# Patient Record
Sex: Female | Born: 1945 | Race: Black or African American | Hispanic: No | Marital: Married | Smoking: Never smoker
Health system: Southern US, Community
[De-identification: ages and names within clinical notes are randomized; demographics above are authoritative.]

## PROBLEM LIST (undated history)

## (undated) DIAGNOSIS — D219 Benign neoplasm of connective and other soft tissue, unspecified: Secondary | ICD-10-CM

## (undated) DIAGNOSIS — I10 Essential (primary) hypertension: Secondary | ICD-10-CM

## (undated) DIAGNOSIS — M8580 Other specified disorders of bone density and structure, unspecified site: Secondary | ICD-10-CM

## (undated) DIAGNOSIS — Z86010 Personal history of colonic polyps: Secondary | ICD-10-CM

## (undated) DIAGNOSIS — R351 Nocturia: Secondary | ICD-10-CM

## (undated) HISTORY — DX: Personal history of colonic polyps: Z86.010

## (undated) HISTORY — DX: Benign neoplasm of connective and other soft tissue, unspecified: D21.9

## (undated) HISTORY — PX: TUBAL LIGATION: SHX77

## (undated) HISTORY — DX: Other specified disorders of bone density and structure, unspecified site: M85.80

## (undated) HISTORY — DX: Essential (primary) hypertension: I10

## (undated) HISTORY — DX: Nocturia: R35.1

## (undated) HISTORY — PX: MYOMECTOMY: SHX85

---

## 1994-04-10 HISTORY — PX: SALPINGECTOMY: SHX328

## 1997-10-29 ENCOUNTER — Ambulatory Visit (HOSPITAL_COMMUNITY): Admission: RE | Admit: 1997-10-29 | Discharge: 1997-10-29 | Payer: Self-pay | Admitting: Internal Medicine

## 1998-09-10 ENCOUNTER — Other Ambulatory Visit: Admission: RE | Admit: 1998-09-10 | Discharge: 1998-09-10 | Payer: Self-pay | Admitting: Obstetrics and Gynecology

## 1999-09-22 ENCOUNTER — Other Ambulatory Visit: Admission: RE | Admit: 1999-09-22 | Discharge: 1999-09-22 | Payer: Self-pay | Admitting: Obstetrics and Gynecology

## 2000-02-20 ENCOUNTER — Emergency Department (HOSPITAL_COMMUNITY): Admission: EM | Admit: 2000-02-20 | Discharge: 2000-02-20 | Payer: Self-pay | Admitting: Emergency Medicine

## 2000-02-20 ENCOUNTER — Encounter: Payer: Self-pay | Admitting: Emergency Medicine

## 2000-12-06 ENCOUNTER — Other Ambulatory Visit: Admission: RE | Admit: 2000-12-06 | Discharge: 2000-12-06 | Payer: Self-pay | Admitting: Obstetrics and Gynecology

## 2002-01-15 ENCOUNTER — Other Ambulatory Visit: Admission: RE | Admit: 2002-01-15 | Discharge: 2002-01-15 | Payer: Self-pay | Admitting: Obstetrics and Gynecology

## 2002-09-26 ENCOUNTER — Encounter: Payer: Self-pay | Admitting: Emergency Medicine

## 2002-09-26 ENCOUNTER — Inpatient Hospital Stay (HOSPITAL_COMMUNITY): Admission: EM | Admit: 2002-09-26 | Discharge: 2002-09-28 | Payer: Self-pay | Admitting: Emergency Medicine

## 2002-09-26 ENCOUNTER — Encounter (INDEPENDENT_AMBULATORY_CARE_PROVIDER_SITE_OTHER): Payer: Self-pay | Admitting: *Deleted

## 2002-09-26 ENCOUNTER — Encounter: Payer: Self-pay | Admitting: General Surgery

## 2002-09-27 ENCOUNTER — Encounter: Payer: Self-pay | Admitting: Surgery

## 2003-04-11 DIAGNOSIS — M8580 Other specified disorders of bone density and structure, unspecified site: Secondary | ICD-10-CM

## 2003-04-11 HISTORY — DX: Other specified disorders of bone density and structure, unspecified site: M85.80

## 2003-10-21 ENCOUNTER — Other Ambulatory Visit: Admission: RE | Admit: 2003-10-21 | Discharge: 2003-10-21 | Payer: Self-pay | Admitting: Obstetrics and Gynecology

## 2003-11-03 ENCOUNTER — Ambulatory Visit (HOSPITAL_COMMUNITY): Admission: RE | Admit: 2003-11-03 | Discharge: 2003-11-03 | Payer: Self-pay | Admitting: Gastroenterology

## 2003-11-03 ENCOUNTER — Encounter (INDEPENDENT_AMBULATORY_CARE_PROVIDER_SITE_OTHER): Payer: Self-pay | Admitting: Specialist

## 2004-11-01 ENCOUNTER — Other Ambulatory Visit: Admission: RE | Admit: 2004-11-01 | Discharge: 2004-11-01 | Payer: Self-pay | Admitting: Obstetrics and Gynecology

## 2005-11-15 ENCOUNTER — Other Ambulatory Visit: Admission: RE | Admit: 2005-11-15 | Discharge: 2005-11-15 | Payer: Self-pay | Admitting: Obstetrics and Gynecology

## 2006-02-03 ENCOUNTER — Emergency Department (HOSPITAL_COMMUNITY): Admission: EM | Admit: 2006-02-03 | Discharge: 2006-02-04 | Payer: Self-pay | Admitting: Emergency Medicine

## 2007-05-23 ENCOUNTER — Emergency Department (HOSPITAL_COMMUNITY): Admission: EM | Admit: 2007-05-23 | Discharge: 2007-05-23 | Payer: Self-pay | Admitting: Emergency Medicine

## 2009-11-01 ENCOUNTER — Encounter: Admission: RE | Admit: 2009-11-01 | Discharge: 2009-11-01 | Payer: Self-pay | Admitting: Internal Medicine

## 2010-08-26 NOTE — Op Note (Signed)
NAME:  Suzanne Chung, Suzanne Chung                      ACCOUNT NO.:  [id number]   MEDICAL RECORD NO.:  [id number]                   PATIENT TYPE:  AMB   LOCATION:  ENDO                                 FACILITY:  [facility]   PHYSICIAN:  Petra Kuba, M.D.                 DATE OF BIRTH:  1945/05/05   DATE OF PROCEDURE:  11/03/2003  DATE OF DISCHARGE:                                 OPERATIVE REPORT   PROCEDURE:  Colonoscopy with polypectomy.   INDICATION:  Screening.  Consent was signed after risks, benefits, methods,  options thoroughly discussed in the office.   MEDICINES USED:  1. Demerol 60.  2. Versed 7.   DESCRIPTION OF PROCEDURE:  Rectal inspection was pertinent for external  hemorrhoids, small.  Digital exam was negative.  The video pediatric  adjustable colonoscope was inserted, fairly easily advanced around the colon  to the cecum.  This did require some abdominal pressure but no position  changes.  No abnormality was seen on insertion.  The cecum was identified by  the appendiceal orifice and the ileocecal valve.  The prep was adequate.  There was some liquid stool that required washing and suctioning.  On slow  withdrawal through the colon, in the cecum a tiny-to-small polyp was seen  and was hot biopsied x 2.  There was an additional 3 ascending and  transverse tiny polyps which were hot biopsied and put in the first  container.  Scope was withdrawn around the left side of the colon, and 2  tiny left-sided polyps were seen, hot biopsied, and put in a second  container.  No other abnormalities were seen as we slowly withdrew back to  the rectum.  Anorectal pull-through and retroflexion confirmed some small  hemorrhoids.  The scope was reinserted a short ways up the left side of the  colon; air was suctioned, scope removed.  The patient tolerated the  procedure well.  There was no obvious immediate complication.   ENDOSCOPIC DIAGNOSES:  1. Internal/external hemorrhoids.  2. Few  tiny left and right polyps, hot biopsied in all areas.  3. Otherwise, within normal limits to the cecum.   PLAN:  1. Await pathology to determine future colonic screening.  2. Happy to see back p.r.n..  3. Otherwise, return care to Dr. Su Hilt for the customary health care     maintenance to include yearly rectals and guaiacs.                                               Petra Kuba, M.D.    MEM/MEDQ  D:  11/03/2003  T:  11/03/2003  Job:  [id number]   cc:   Antony Madura, M.D.  [address]  [place]  [place] [address]  Fax: [phone]

## 2010-08-26 NOTE — Op Note (Signed)
NAME:  Suzanne Chung, Suzanne Chung                      ACCOUNT NO.:  [id number]   MEDICAL RECORD NO.:  [id number]                   PATIENT TYPE:  INP   LOCATION:  0476                                 FACILITY:  [facility]   PHYSICIAN:  Ollen Gross. Vernell Morgans, M.D.              DATE OF BIRTH:  01/22/1946   DATE OF PROCEDURE:  09/26/2002  DATE OF DISCHARGE:  09/28/2002                                 OPERATIVE REPORT   PREOPERATIVE DIAGNOSIS:  Cholecystitis.   POSTOPERATIVE DIAGNOSIS:  Cholecystitis.   PROCEDURE:  Laparoscopic cholecystectomy with intraoperative cholangiogram.   SURGEON:  Ollen Gross. Carolynne Edouard, M.D.   ASSISTANT:  Currie Paris, M.D.   ANESTHESIA:  General endotracheal.   DESCRIPTION OF PROCEDURE:  After informed consent was obtained, the patient  was brought to the operating room, placed in a supine position on the  operating room table. After adequate induction of general anesthesia, the  patient's abdomen was prepped with Betadine and draped in the usual sterile  manner. The area above the umbilicus was infiltrated with 0.25% Marcaine, a  small incision was made with a 15 blade knife. This incision was carried  down through the skin and subcutaneous tissue using blunt dissection, using  the Kelly clamp and [employer] retractors until the linea alba was  identified. The linea alba was incised with a 15 blade knife and each side  was grasped with Kocher clamps and elevated anteriorly. The preperitoneal  space was then probed bluntly with a hemostat until the peritoneum was  opened and access was gained to the abdominal cavity. A #0 Vicryl  pursestring stitch was placed in the fascia surrounding the opening. A  Hasson cannula was placed through the opening and anchored in place to the  previously placed Vicryl pursestring stitch. The abdomen was then  insufflated with carbon dioxide without difficulty. The patient was placed  in the head up position, a laparoscope was placed through  the Hasson cannula  and the right upper quadrant was inspected. The dome of the gallbladder and  liver were readily identified and the gastric region was then infiltrated  with 0.25% Marcaine and a small incision was made with a 15 blade knife and  a 10 mm port was placed bluntly through this incision into the abdominal  cavity under direct vision. Sites are then chosen laterally on the right  side of the abdomen for placement of 5 mm ports. Each of these areas was  infiltrated with 0.25% Marcaine. Small stab incisions were made with a 15  blade knife, 5 mm ports were placed bluntly through these incisions into the  abdominal cavity under direct vision. An aspirating device was then placed  through the epigastric port. The gallbladder was aspirated of its contents  so that it could be grasped. A blunt grasper was then placed through the  lateral most 5 mm port and used to grasp the dome of the gallbladder and  elevate it anteriorly and superiorly. Another blunt grasper was placed  through the other 5 mm port and used to retract on the body and neck of the  gallbladder. A dissector was placed through the upper midline port through  the epigastric port and using the electrocautery, the peritoneal reflection  at the gallbladder neck was opened, blunt dissection was then used carefully  in this area until the gallbladder neck cystic duct junction was readily  identified and a good window was created. This area was dissected bluntly  circumferentially until the window was created. Care was taken to keep the  common duct medial to the dissection. A single clip was placed on the  gallbladder neck, a small ductotomy was made on the cystic duct. A 14 gauge  Angiocath was then placed percutaneously into the anterior abdominal wall  and a Reddick cholangiogram catheter was placed through the NG catheter and  flushed. The Reddick catheter was then placed within the cystic duct and  anchored in place  with a clip. A cholangiogram was obtained that showed no  filling defects, good emptying into the duodenum and adequate length on the  cystic duct. The anchoring clip and catheter was then removed from the  patient. Three clips were placed proximally on the cystic duct and the duct  was divided between the two sets of clips. Posterior to this, the cystic  artery was identified and again dissected in a circumferential manner  bluntly until a good window was created. Two clips were placed proximally,  one distally on the artery and the artery was divided between the two. Next,  a laparoscopic hook cautery device was used to separate the gallbladder from  the liver bed. Prior to completely detaching the gallbladder from the liver  bed, the liver bed was inspected and several small bleeding points were  coagulated with the electrocautery. The gallbladder was then detached the  rest of the way from the liver bed without difficulty and an endoscopic bag  was placed within the abdomen. The gallbladder was placed within the bag and  the bag was sealed. The laparoscope was then moved to the upper midline port  and a gallbladder grasper was placed through the Hasson cannula and used to  grasp the opening in the bag. The bag with the gallbladder was then removed  through the supraumbilical port without difficulty. The fascial defect was  then closed with the previously placed Vicryl pursestring stitch. The  abdomen was then irrigated with copious amounts of saline until the effluent  was clear.  Liver bed was inspected again and found to be hemostatic. The  ports were then all removed under direct vision and were found to be  hemostatic, gas was allowed to escape. The skin incisions were all closed  with interrupted 4-0 Monocryl subcuticular stitch, Benzoin and Steri-Strips  were applied. At the end of the case, all needle, sponge and instrument counts were correct. The patient was then awakened and  taken to the recovery  room in stable condition.                                               Ollen Gross. Vernell Morgans, M.D.    PST/MEDQ  D:  10/02/2002  T:  10/02/2002  Job:  [id number]

## 2010-08-26 NOTE — H&P (Signed)
NAME:  Suzanne Chung, Suzanne Chung                      ACCOUNT NO.:  [id number]   MEDICAL RECORD NO.:  [id number]                   PATIENT TYPE:  EMS   LOCATION:  ED                                   FACILITY:  [facility]   PHYSICIAN:  Ollen Gross. Vernell Morgans, M.D.              DATE OF BIRTH:  19-Jan-1946   DATE OF ADMISSION:  09/26/2002  DATE OF DISCHARGE:                                HISTORY & PHYSICAL   HISTORY OF PRESENT ILLNESS:  The patient is a 65 year old white female who  states that shortly after eating chicken fingers for dinner last night she  developed epigastric pain.  The pain was localized to her epigastric/right  upper quadrant region, did not radiate to her back.  The pain became more  severe as the evening went on, and she had several episodes of vomiting with  this.  She is not running any fever.  The pain is still present but less  severe than it was earlier this evening.  She became concerned enough that  she came to the emergency department for further evaluation.   REVIEW OF SYSTEMS:  She currently does not have any nausea or vomiting,  fevers, chills, chest pain, shortness of breath, diarrhea, dysuria.  She  does have some dizziness which she has been dealing with since about May.  The rest of her review of systems is unremarkable.   PAST MEDICAL HISTORY:  Significant for dizziness since May, question of some  high blood pressure.   PAST SURGICAL HISTORY:  Significant for excision of fibroids from the  uterus.   MEDICATIONS:  Cefalexin.   ALLERGIES:  No known drug allergies.   SOCIAL HISTORY:  She denies use of alcohol or tobacco products.   FAMILY HISTORY:  Noncontributory.   PHYSICAL EXAMINATION:  VITAL SIGNS:  Temperature is 99.3, blood pressure  177/93, pulse of 93.  GENERAL:  Moderately obese black female in no acute distress.  SKIN:  Warm and dry.  With no jaundice.  HEENT:  Eyes, extraocular muscles are intact.  Pupils are equal, round, and  reactive to  light.  Sclerae are nonicteric.  NECK:  Has no bruits.  I could not palpate any thyroid masses.  Trachea is  midline.  LUNGS:  Clear bilaterally, with no use of accessory respiratory muscles.  HEART:  Regular rate and rhythm with impulse in the left chest.  ABDOMEN:  Soft, with some mild tenderness in the epigastric region.  No  palpable mass or hepatosplenomegaly, but she is obese.  No guarding or  peritoneal signs.  EXTREMITIES:  No cyanosis, clubbing, or edema.  PSYCHOLOGIC:  She is alert and oriented x3 today, with no evidence of  anxiety or depression.   LABORATORY WORK:  On review of her laboratory work she had a slightly  elevated white count at 10,600.  She had many bacteria in her urine.  Her  electrolytes were okay, and her  liver functions were normal.  Her lipase was  also normal.   On review of her ultrasound she had stones in the gallbladder with  gallbladder wall thickening consistent with acute cholecystitis.  There was  no real dilatation of the bile ducts.   ASSESSMENT AND PLAN:  This is a 65 year old black female with acute  cholecystitis with cholelithiasis.  I have recommended that she consider  having her gallbladder removed, and she would also like to have this done.  I have explained to her in detail the risks and benefits of the operation as  well as some of the technical aspects, and she understands and wishes to  proceed.  We will plan to do this for her hopefully later today or tomorrow  in the operating room.  In the meantime, will admit her for IV hydration,  antibiotic therapy, and pain management.                                               Ollen Gross. Vernell Morgans, M.D.    PST/MEDQ  D:  09/26/2002  T:  09/26/2002  Job:  [id number]

## 2010-12-30 LAB — I-STAT 8, (EC8 V) (CONVERTED LAB)
BUN: 10
Bicarbonate: 24.9 — ABNORMAL HIGH
Glucose, Bld: 79
HCT: 42
Hemoglobin: 14.3
Sodium: 139
pCO2, Ven: 40 — ABNORMAL LOW
pH, Ven: 7.402 — ABNORMAL HIGH

## 2010-12-30 LAB — POCT CARDIAC MARKERS
CKMB, poc: 3.1
Myoglobin, poc: 88.9
Operator id: 198171

## 2010-12-30 LAB — POCT I-STAT CREATININE: Creatinine, Ser: 0.9

## 2010-12-30 LAB — D-DIMER, QUANTITATIVE: D-Dimer, Quant: <0.22

## 2011-12-20 ENCOUNTER — Other Ambulatory Visit: Payer: Self-pay | Admitting: Obstetrics and Gynecology

## 2011-12-20 DIAGNOSIS — Z1231 Encounter for screening mammogram for malignant neoplasm of breast: Secondary | ICD-10-CM

## 2012-01-16 ENCOUNTER — Ambulatory Visit: Payer: Self-pay

## 2012-01-18 DIAGNOSIS — Z86010 Personal history of colonic polyps: Secondary | ICD-10-CM | POA: Insufficient documentation

## 2012-01-18 DIAGNOSIS — M8580 Other specified disorders of bone density and structure, unspecified site: Secondary | ICD-10-CM | POA: Insufficient documentation

## 2012-01-18 DIAGNOSIS — R351 Nocturia: Secondary | ICD-10-CM | POA: Insufficient documentation

## 2012-01-18 DIAGNOSIS — D219 Benign neoplasm of connective and other soft tissue, unspecified: Secondary | ICD-10-CM | POA: Insufficient documentation

## 2012-01-18 DIAGNOSIS — I10 Essential (primary) hypertension: Secondary | ICD-10-CM | POA: Insufficient documentation

## 2012-01-23 ENCOUNTER — Ambulatory Visit (INDEPENDENT_AMBULATORY_CARE_PROVIDER_SITE_OTHER): Payer: Medicare Other

## 2012-01-23 DIAGNOSIS — Z1231 Encounter for screening mammogram for malignant neoplasm of breast: Secondary | ICD-10-CM

## 2012-01-24 ENCOUNTER — Ambulatory Visit (INDEPENDENT_AMBULATORY_CARE_PROVIDER_SITE_OTHER): Payer: Medicare Other | Admitting: Obstetrics and Gynecology

## 2012-01-24 ENCOUNTER — Ambulatory Visit (INDEPENDENT_AMBULATORY_CARE_PROVIDER_SITE_OTHER): Payer: Medicare Other

## 2012-01-24 ENCOUNTER — Encounter: Payer: Self-pay | Admitting: Obstetrics and Gynecology

## 2012-01-24 VITALS — BP 132/72 | Ht 61.25 in | Wt 183.0 lb

## 2012-01-24 DIAGNOSIS — Z780 Asymptomatic menopausal state: Secondary | ICD-10-CM

## 2012-01-24 DIAGNOSIS — Z124 Encounter for screening for malignant neoplasm of cervix: Secondary | ICD-10-CM

## 2012-01-24 DIAGNOSIS — R351 Nocturia: Secondary | ICD-10-CM

## 2012-01-24 DIAGNOSIS — D219 Benign neoplasm of connective and other soft tissue, unspecified: Secondary | ICD-10-CM

## 2012-01-24 DIAGNOSIS — M8580 Other specified disorders of bone density and structure, unspecified site: Secondary | ICD-10-CM

## 2012-01-24 NOTE — Progress Notes (Signed)
ANNUAL:  mcr PAP and pelvic  Last Pap: 01/23/2011 WNL: Yes Regular Periods:no Contraception: Post-Menopause  Monthly Breast exam:yes Tetanus<28yrs:yes Nl.Bladder Function:yes Daily BMs:yes Healthy Diet:yes Calcium:no Mammogram:yes Date of Mammogram: 01/23/2012 Exercise:yes Have often Exercise: 3 times weekly Seatbelt: yes Abuse at home: no Stressful work:no Sigmoid-colonoscopy: 2008 Bone Density: Yes 2011 PCP: Dr. Burton Apley Change in PMH: None Change in [facility]: None  Subjective:    Suzanne Chung is a 66 y.o. female G1P1 who presents for annual exam.  The patient has no complaints today.   The following portions of the patient's history were reviewed and updated as appropriate: allergies, current medications, past family history, past medical history, past social history, past surgical history and problem list.  Review of Systems Pertinent items are noted in HPI. Gastrointestinal:No change in bowel habits, no abdominal pain, no rectal bleeding Genitourinary:negative for dysuria, frequency, hematuria, nocturia and urinary incontinence    Objective:     BP 132/72  Ht 5' 1.25" (1.556 m)  Wt 183 lb (83.008 kg)  BMI 34.30 kg/m2  Weight:  Wt Readings from Last 1 Encounters:  01/24/12 183 lb (83.008 kg)     BMI: Body mass index is 34.30 kg/(m^2). General Appearance: Alert, appropriate appearance for age. No acute distress HEENT: Grossly normal Neck / Thyroid: Supple, no masses, nodes or enlargement Lungs: clear to auscultation bilaterally Back: No CVA tenderness Breast Exam: No masses or nodes.No dimpling, nipple retraction or discharge. Cardiovascular: Regular rate and rhythm. S1, S2, no murmur Gastrointestinal: Soft, non-tender, no masses or organomegaly Pelvic Exam: External genitalia: normal general appearance Vaginal: atrophic mucosa Cervix: normal appearance Adnexa: non palpable Uterus: doesn't feel enlarged Exam limited by body habitus Rectovaginal:  normal rectal, no masses Lymphatic Exam: Non-palpable nodes in neck, clavicular, axillary, or inguinal regions Skin: no rash or abnormalities Neurologic: Normal gait and speech, no tremor  Psychiatric: Alert and oriented, appropriate affect.    Urinalysis:Not done    Assessment:    Menopause no sx Hx of myomectomy with no sx of fibroids    Plan:   Mammogram done 01/24/12 results pending pap smear with HR HPV.  If nl, repeat in 3-5 yrears return annually or prn Follow-up:  For DXA   Dierdre Forth MD

## 2012-01-26 ENCOUNTER — Encounter: Payer: Self-pay | Admitting: Obstetrics and Gynecology

## 2012-01-26 LAB — PAP IG AND HPV HIGH-RISK: HPV DNA High Risk: NOT DETECTED

## 2013-10-29 ENCOUNTER — Emergency Department (HOSPITAL_COMMUNITY)
Admission: EM | Admit: 2013-10-29 | Discharge: 2013-10-29 | Disposition: A | Discharge status: Home / Self Care | Payer: Medicare PPO | Attending: Emergency Medicine | Admitting: Emergency Medicine

## 2013-10-29 ENCOUNTER — Encounter (HOSPITAL_COMMUNITY): Payer: Self-pay | Admitting: Emergency Medicine

## 2013-10-29 DIAGNOSIS — H1130 Conjunctival hemorrhage, unspecified eye: Secondary | ICD-10-CM | POA: Insufficient documentation

## 2013-10-29 DIAGNOSIS — R42 Dizziness and giddiness: Secondary | ICD-10-CM | POA: Insufficient documentation

## 2013-10-29 DIAGNOSIS — Z8739 Personal history of other diseases of the musculoskeletal system and connective tissue: Secondary | ICD-10-CM | POA: Insufficient documentation

## 2013-10-29 DIAGNOSIS — Z8742 Personal history of other diseases of the female genital tract: Secondary | ICD-10-CM | POA: Insufficient documentation

## 2013-10-29 DIAGNOSIS — Z860100 Personal history of colon polyps, unspecified: Secondary | ICD-10-CM | POA: Insufficient documentation

## 2013-10-29 DIAGNOSIS — I10 Essential (primary) hypertension: Secondary | ICD-10-CM | POA: Insufficient documentation

## 2013-10-29 DIAGNOSIS — Z79899 Other long term (current) drug therapy: Secondary | ICD-10-CM | POA: Insufficient documentation

## 2013-10-29 LAB — BASIC METABOLIC PANEL
Anion gap: 13 (ref 5–15)
BUN: 10 mg/dL (ref 6–23)
CO2: 28 mEq/L (ref 19–32)
CREATININE: 0.94 mg/dL (ref 0.50–1.10)
Calcium: 9.4 mg/dL (ref 8.4–10.5)
Chloride: 97 mEq/L (ref 96–112)
GFR calc Af Amer: 71 mL/min — ABNORMAL LOW (ref >90)
GFR, EST NON AFRICAN AMERICAN: 61 mL/min — AB (ref >90)
GLUCOSE: 110 mg/dL — AB (ref 70–99)
Potassium: 3.2 mEq/L — ABNORMAL LOW (ref 3.7–5.3)
SODIUM: 138 meq/L (ref 137–147)

## 2013-10-29 MED ORDER — AMLODIPINE BESYLATE 5 MG PO TABS: 5.0000 mg | ORAL_TABLET | Freq: Once | ORAL | Status: AC

## 2013-10-29 MED ORDER — AMLODIPINE BESYLATE 5 MG PO TABS
5.0000 mg | ORAL_TABLET | Freq: Once | ORAL | Status: AC
  Administered 2013-10-29: 5 mg via ORAL
  Filled 2013-10-29: qty 1

## 2013-10-29 NOTE — Discharge Instructions (Signed)
Followup with your Dr. tomorrow Hypertension Hypertension, commonly called high blood pressure, is when the force of blood pumping through your arteries is too strong. Your arteries are the blood vessels that carry blood from your heart throughout your body. A blood pressure reading consists of a higher number over a lower number, such as 110/72. The higher number (systolic) is the pressure inside your arteries when your heart pumps. The lower number (diastolic) is the pressure inside your arteries when your heart relaxes. Ideally you want your blood pressure below 120/80. Hypertension forces your heart to work harder to pump blood. Your arteries may become narrow or stiff. Having hypertension puts you at risk for heart disease, stroke, and other problems.  RISK FACTORS Some risk factors for high blood pressure are controllable. Others are not.  Risk factors you cannot control include:   Race. You may be at higher risk if you are African American.  Age. Risk increases with age.  Gender. Men are at higher risk than women before age 62 years. After age 69, women are at higher risk than men. Risk factors you can control include:  Not getting enough exercise or physical activity.  Being overweight.  Getting too much fat, sugar, calories, or salt in your diet.  Drinking too much alcohol. SIGNS AND SYMPTOMS Hypertension does not usually cause signs or symptoms. Extremely high blood pressure (hypertensive crisis) may cause headache, anxiety, shortness of breath, and nosebleed. DIAGNOSIS  To check if you have hypertension, your health care provider will measure your blood pressure while you are seated, with your arm held at the level of your heart. It should be measured at least twice using the same arm. Certain conditions can cause a difference in blood pressure between your right and left arms. A blood pressure reading that is higher than normal on one occasion does not mean that you need  treatment. If one blood pressure reading is high, ask your health care provider about having it checked again. TREATMENT  Treating high blood pressure includes making lifestyle changes and possibly taking medicine. Living a healthy lifestyle can help lower high blood pressure. You may need to change some of your habits. Lifestyle changes may include:  Following the DASH diet. This diet is high in fruits, vegetables, and whole grains. It is low in salt, red meat, and added sugars.  Getting at least 2 hours of brisk physical activity every week.  Losing weight if necessary.  Not smoking.  Limiting alcoholic beverages.  Learning ways to reduce stress. If lifestyle changes are not enough to get your blood pressure under control, your health care provider may prescribe medicine. You may need to take more than one. Work closely with your health care provider to understand the risks and benefits. HOME CARE INSTRUCTIONS  Have your blood pressure rechecked as directed by your health care provider.   Take medicines only as directed by your health care provider. Follow the directions carefully. Blood pressure medicines must be taken as prescribed. The medicine does not work as well when you skip doses. Skipping doses also puts you at risk for problems.   Do not smoke.   Monitor your blood pressure at home as directed by your health care provider. SEEK MEDICAL CARE IF:   You think you are having a reaction to medicines taken.  You have recurrent headaches or feel dizzy.  You have swelling in your ankles.  You have trouble with your vision. SEEK IMMEDIATE MEDICAL CARE IF:  You develop  a severe headache or confusion.  You have unusual weakness, numbness, or feel faint.  You have severe chest or abdominal pain.  You vomit repeatedly.  You have trouble breathing. MAKE SURE YOU:   Understand these instructions.  Will watch your condition.  Will get help right away if you are  not doing well or get worse. Document Released: 03/27/2005 Document Revised: 08/11/2013 Document Reviewed: 01/17/2013 [facility] Patient Information 2015 [place], [place]. This information is not intended to replace advice given to you by your health care provider. Make sure you discuss any questions you have with your health care provider.

## 2013-10-29 NOTE — ED Notes (Signed)
Pt states that she has felt "off today". Dizzy, lightheaded.  States that she took 2 aspirin and a teaspoon of vinegar.  Pt states that she has hx of htn but does not take medicine for it.  Pt states that she checked her BP at [facility] and it was 767 systolic.

## 2013-10-29 NOTE — ED Provider Notes (Signed)
CSN: [id number]     Arrival date & time 10/29/13  1853 History   First MD Initiated Contact with Patient 10/29/13 1913     Chief Complaint  Patient presents with  . Hypertension  . Eye Problem     (Consider location/radiation/quality/duration/timing/severity/associated sxs/prior Treatment) HPI Comments: Patient here complaining of becoming dizzy and lightheaded when she was walking. She took 2 aspirin and a teaspoon of vinegar and did not note any change in her symptoms. She went to the drugstore and her systolic blood pressure was 200. She does have a history of hypertension and takes a diuretic and has been compliant with her medications. She denies any associated chest pain, headache, shortness of breath, syncope or near-syncope. She presented here for further evaluation  Patient is a 68 y.o. female presenting with hypertension and eye problem. The history is provided by the patient.  Hypertension  Eye Problem   Past Medical History  Diagnosis Date  . Hypertension   . Nocturia     frequent  . Fibroids   . Osteopenia 2005  . History of colon polyps    Past Surgical History  Procedure Laterality Date  . Myomectomy  1996 and 1980's  . Salpingectomy  1996    right;partial  . Tubal ligation     Family History  Problem Relation Age of Onset  . Dementia Mother   . Other Father     Cardiac arrest   . Colon cancer Maternal Grandmother    History  Substance Use Topics  . Smoking status: Never Smoker   . Smokeless tobacco: Never Used  . Alcohol Use: No   OB History   Grav Para Term Preterm Abortions TAB SAB Ect Mult Living   1 1        1      Review of Systems  All other systems reviewed and are negative.     Allergies  Review of patient's allergies indicates no known allergies.  Home Medications   Prior to Admission medications   Medication Sig Start Date End Date Taking? Authorizing Provider  simvastatin (ZOCOR) 20 MG tablet Take 20 mg by mouth every  evening.    Historical Provider, MD  triamterene-hydrochlorothiazide (MAXZIDE-25) 37.5-25 MG per tablet Take 1 tablet by mouth daily.    Historical Provider, MD   BP 184/71  Pulse 95  Temp(Src) 97.8 F (36.6 C) (Oral)  Resp 22  SpO2 100% Physical Exam  Nursing note and vitals reviewed. Constitutional: She is oriented to person, place, and time. She appears well-developed and well-nourished.  Non-toxic appearance. No distress.  HENT:  Head: Normocephalic and atraumatic.  Eyes: EOM and lids are normal. Pupils are equal, round, and reactive to light. Right conjunctiva has no hemorrhage. Left conjunctiva has a hemorrhage.  Neck: Normal range of motion. Neck supple. No tracheal deviation present. No mass present.  Cardiovascular: Normal rate, regular rhythm and normal heart sounds.  Exam reveals no gallop.   No murmur heard. Pulmonary/Chest: Effort normal and breath sounds normal. No stridor. No respiratory distress. She has no decreased breath sounds. She has no wheezes. She has no rhonchi. She has no rales.  Abdominal: Soft. Normal appearance and bowel sounds are normal. She exhibits no distension. There is no tenderness. There is no rebound and no CVA tenderness.  Musculoskeletal: Normal range of motion. She exhibits no edema and no tenderness.  Neurological: She is alert and oriented to person, place, and time. She has normal strength. No cranial nerve deficit or sensory  deficit. GCS eye subscore is 4. GCS verbal subscore is 5. GCS motor subscore is 6.  Skin: Skin is warm and dry. No abrasion and no rash noted.  Psychiatric: She has a normal mood and affect. Her speech is normal and behavior is normal.    ED Course  Procedures (including critical care time) Labs Review Labs Reviewed  BASIC METABOLIC PANEL    Imaging Review No results found.   EKG Interpretation None      MDM   Final diagnoses:  None    Patient given Norvasc here and feels better. Will place patient on  Norvasc and she will followup with her Dr. tomorrow. Repeat blood pressure has improved    Leota Jacobsen, MD 10/29/13 2109

## 2014-02-09 ENCOUNTER — Encounter (HOSPITAL_COMMUNITY): Payer: Self-pay | Admitting: Emergency Medicine

## 2019-05-09 ENCOUNTER — Ambulatory Visit: Payer: Medicare Other

## 2019-05-14 ENCOUNTER — Ambulatory Visit: Payer: Medicare Other

## 2019-05-15 ENCOUNTER — Ambulatory Visit: Payer: 59 | Attending: Internal Medicine

## 2019-05-15 DIAGNOSIS — Z23 Encounter for immunization: Secondary | ICD-10-CM | POA: Insufficient documentation

## 2019-05-15 NOTE — Progress Notes (Signed)
   Covid-19 Vaccination Clinic  Name:  Suzanne Chung    MRN: [id number] DOB: 05-Mar-1946  05/15/2019  [name] was observed post Covid-19 immunization for 15 minutes without incidence. She was provided with Vaccine Information Sheet and instruction to access the V-Safe system.   [name] was instructed to call 911 with any severe reactions post vaccine: Marland Kitchen Difficulty breathing  . Swelling of your face and throat  . A fast heartbeat  . A bad rash all over your body  . Dizziness and weakness    Immunizations Administered    Name Date Dose VIS Date Route   Pfizer COVID-19 Vaccine 05/15/2019  8:32 AM 0.3 mL 03/21/2019 Intramuscular   Manufacturer: [place]   Lot: [id number]   [place]: [id number]

## 2019-06-10 ENCOUNTER — Ambulatory Visit: Payer: 59 | Attending: Internal Medicine

## 2019-06-10 DIAGNOSIS — Z23 Encounter for immunization: Secondary | ICD-10-CM | POA: Insufficient documentation

## 2019-06-10 NOTE — Progress Notes (Signed)
   Cov[facility] Vaccination [facility]  Name:  Suzanne Chung    MRN: [id number] DOB: Oct 13, 1945  06/10/2019  Suzanne Chung was observed post Covid-19 immunization for 15 minutes without incident. She was provided with Vaccine Information Sheet and instruction to access the V-Safe system.   Suzanne Chung was instructed to call 911 with any severe reactions post vaccine: Marland Kitchen Difficulty breathing  . Swelling of face and throat  . A fast heartbeat  . A bad rash all over body  . Dizziness and weakness   Immunizations Administered    Name Date Dose VIS Date Route   Pfizer COVID-19 Vaccine 06/10/2019  9:52 AM 0.3 mL 03/21/2019 Intramuscular   Manufacturer: [place]   Lot: [id number]   [place]: [id number]

## 2019-10-07 ENCOUNTER — Ambulatory Visit
Admission: RE | Admit: 2019-10-07 | Discharge: 2019-10-07 | Disposition: A | Discharge status: Home / Self Care | Payer: Medicare Other | Source: Ambulatory Visit | Attending: Internal Medicine | Admitting: Internal Medicine

## 2019-10-07 ENCOUNTER — Other Ambulatory Visit: Payer: Self-pay | Admitting: Internal Medicine

## 2019-10-07 DIAGNOSIS — M549 Dorsalgia, unspecified: Secondary | ICD-10-CM

## 2021-02-17 ENCOUNTER — Other Ambulatory Visit: Payer: Self-pay | Admitting: Obstetrics and Gynecology

## 2021-02-17 DIAGNOSIS — R928 Other abnormal and inconclusive findings on diagnostic imaging of breast: Secondary | ICD-10-CM

## 2021-03-14 ENCOUNTER — Ambulatory Visit
Admission: RE | Admit: 2021-03-14 | Discharge: 2021-03-14 | Disposition: A | Discharge status: Home / Self Care | Payer: Medicare Other | Source: Ambulatory Visit | Attending: Obstetrics and Gynecology | Admitting: Obstetrics and Gynecology

## 2021-03-14 ENCOUNTER — Ambulatory Visit: Payer: 59

## 2021-03-14 DIAGNOSIS — R928 Other abnormal and inconclusive findings on diagnostic imaging of breast: Secondary | ICD-10-CM

## 2021-12-22 ENCOUNTER — Other Ambulatory Visit: Payer: Self-pay | Admitting: Internal Medicine

## 2021-12-22 DIAGNOSIS — Z1231 Encounter for screening mammogram for malignant neoplasm of breast: Secondary | ICD-10-CM

## 2022-03-15 ENCOUNTER — Ambulatory Visit
Admission: RE | Admit: 2022-03-15 | Discharge: 2022-03-15 | Disposition: A | Discharge status: Home / Self Care | Payer: Medicare Other | Source: Ambulatory Visit | Attending: Internal Medicine | Admitting: Internal Medicine

## 2022-03-15 DIAGNOSIS — Z1231 Encounter for screening mammogram for malignant neoplasm of breast: Secondary | ICD-10-CM

## 2022-09-16 IMAGING — MG MM DIGITAL DIAGNOSTIC UNILAT*L* W/ TOMO W/ CAD
4 series · 4 of 12 positions shown · non-contrast
Comparison: Previous exam(s).

CLINICAL DATA: 75-year-old female presenting as a recall from
screening for possible left breast asymmetry.

EXAM:
DIGITAL DIAGNOSTIC UNILATERAL LEFT MAMMOGRAM WITH TOMOSYNTHESIS AND
CAD
TECHNIQUE: Left digital diagnostic mammography and breast tomosynthesis was
performed. The images were evaluated with computer-aided detection.

[L CC synth-2D]
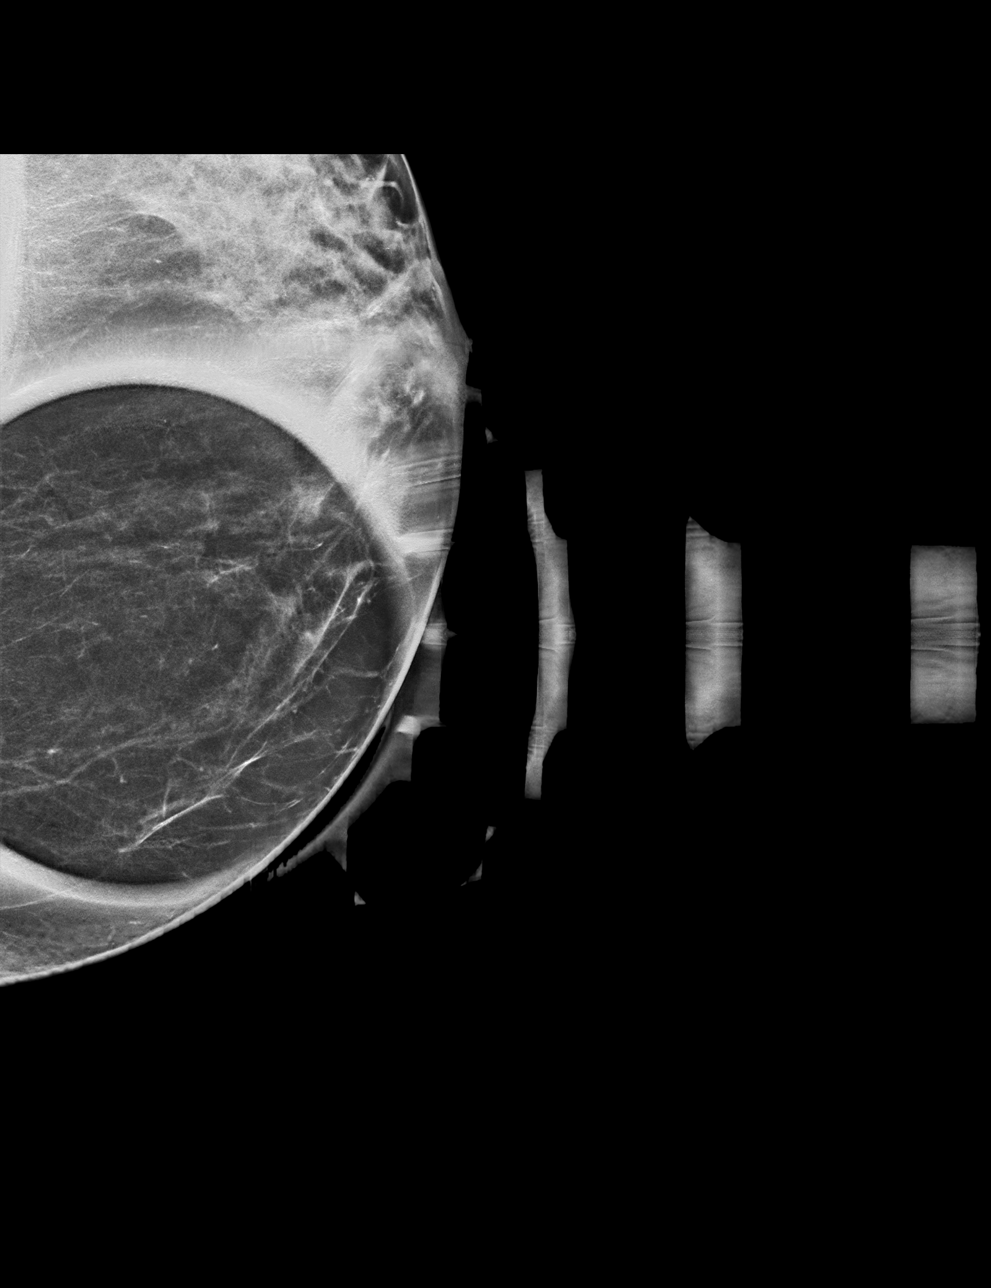

[L ML synth-2D]
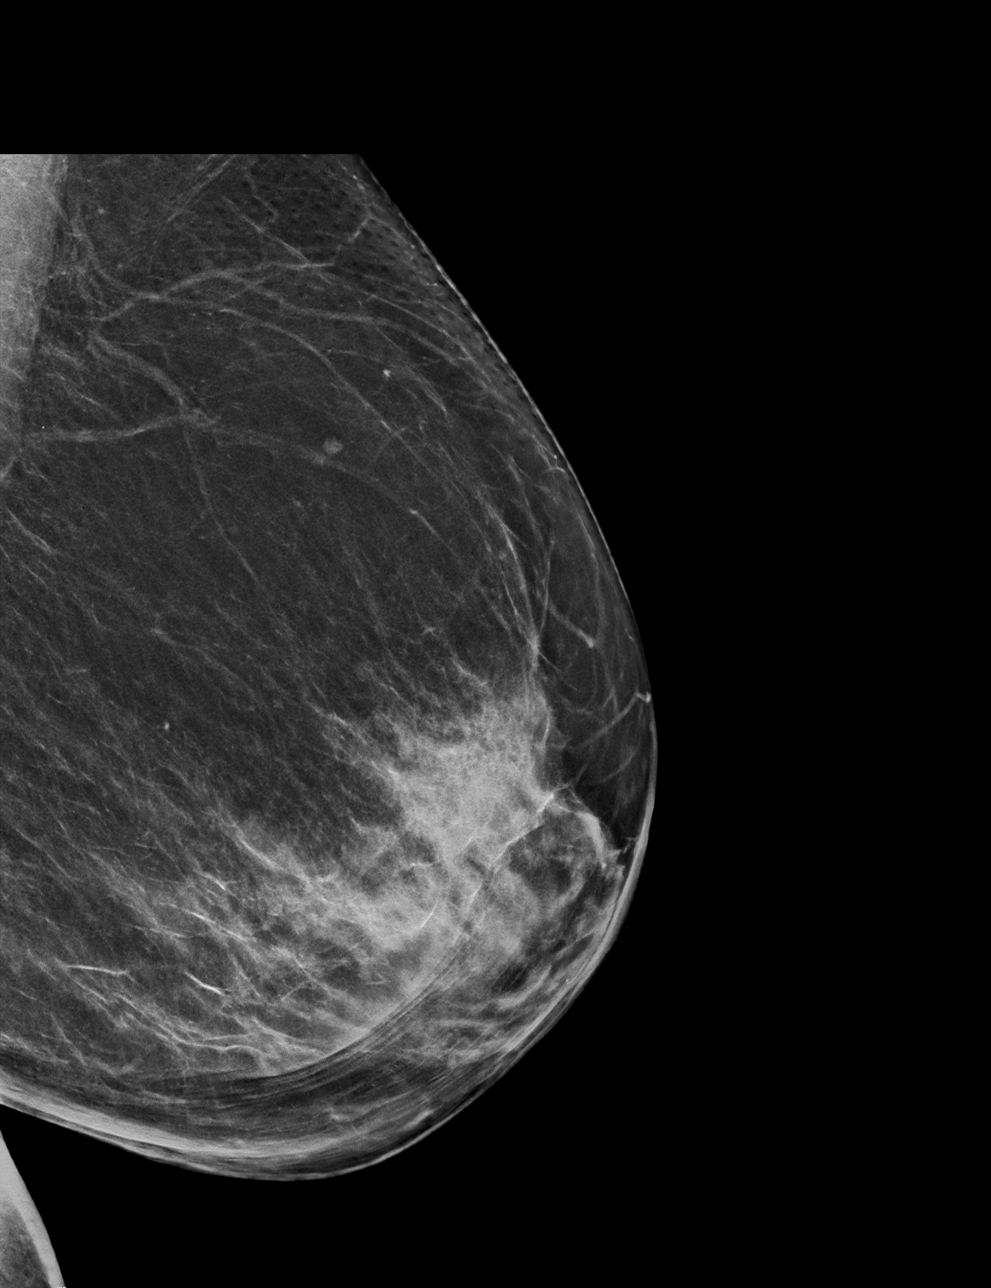

[L CC tomo · tomo slice 27/54.0]
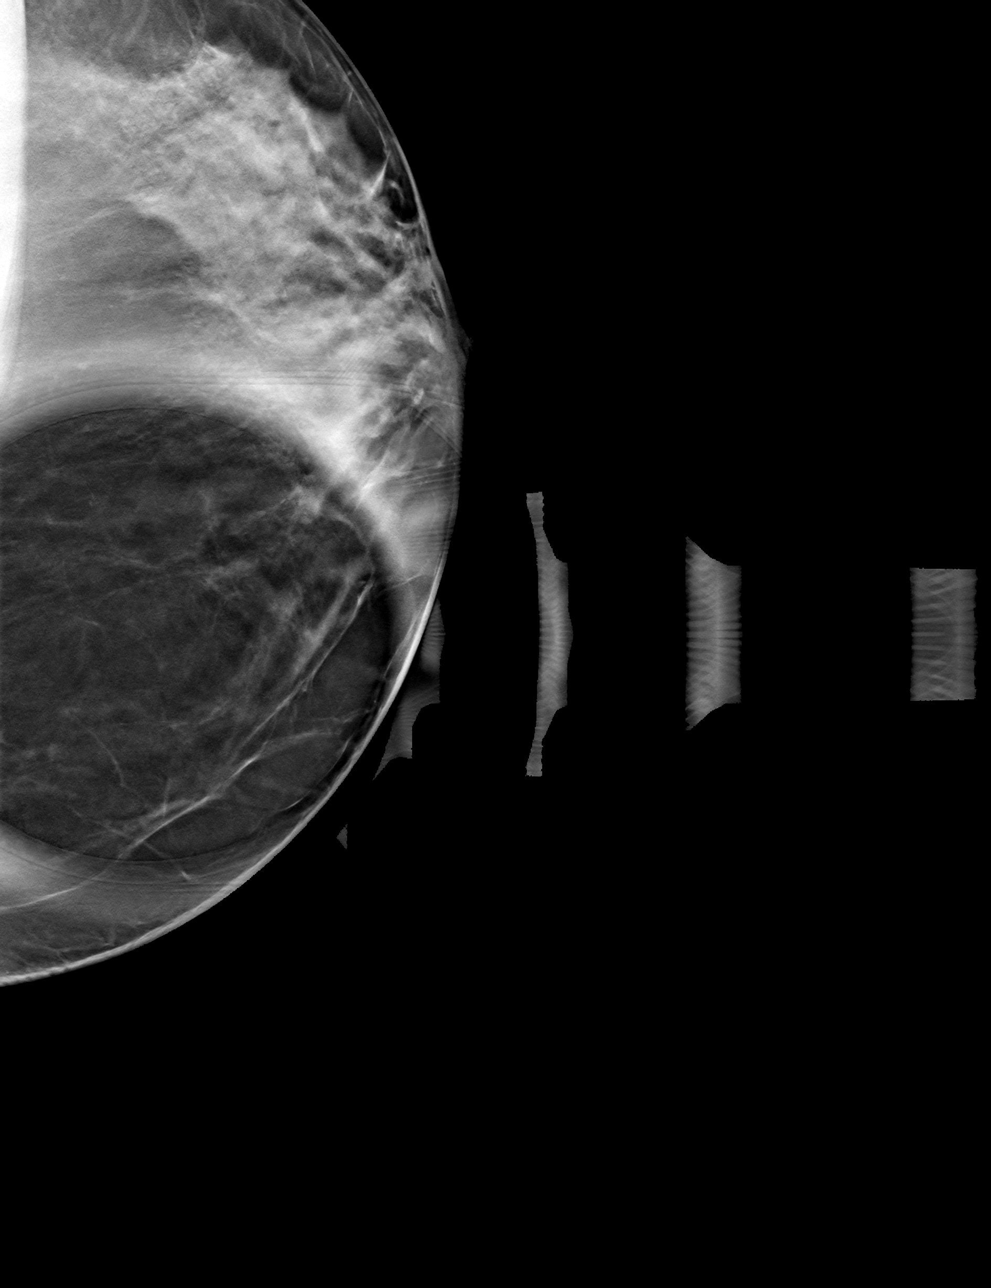

[L ML tomo · tomo slice 35/70.0]
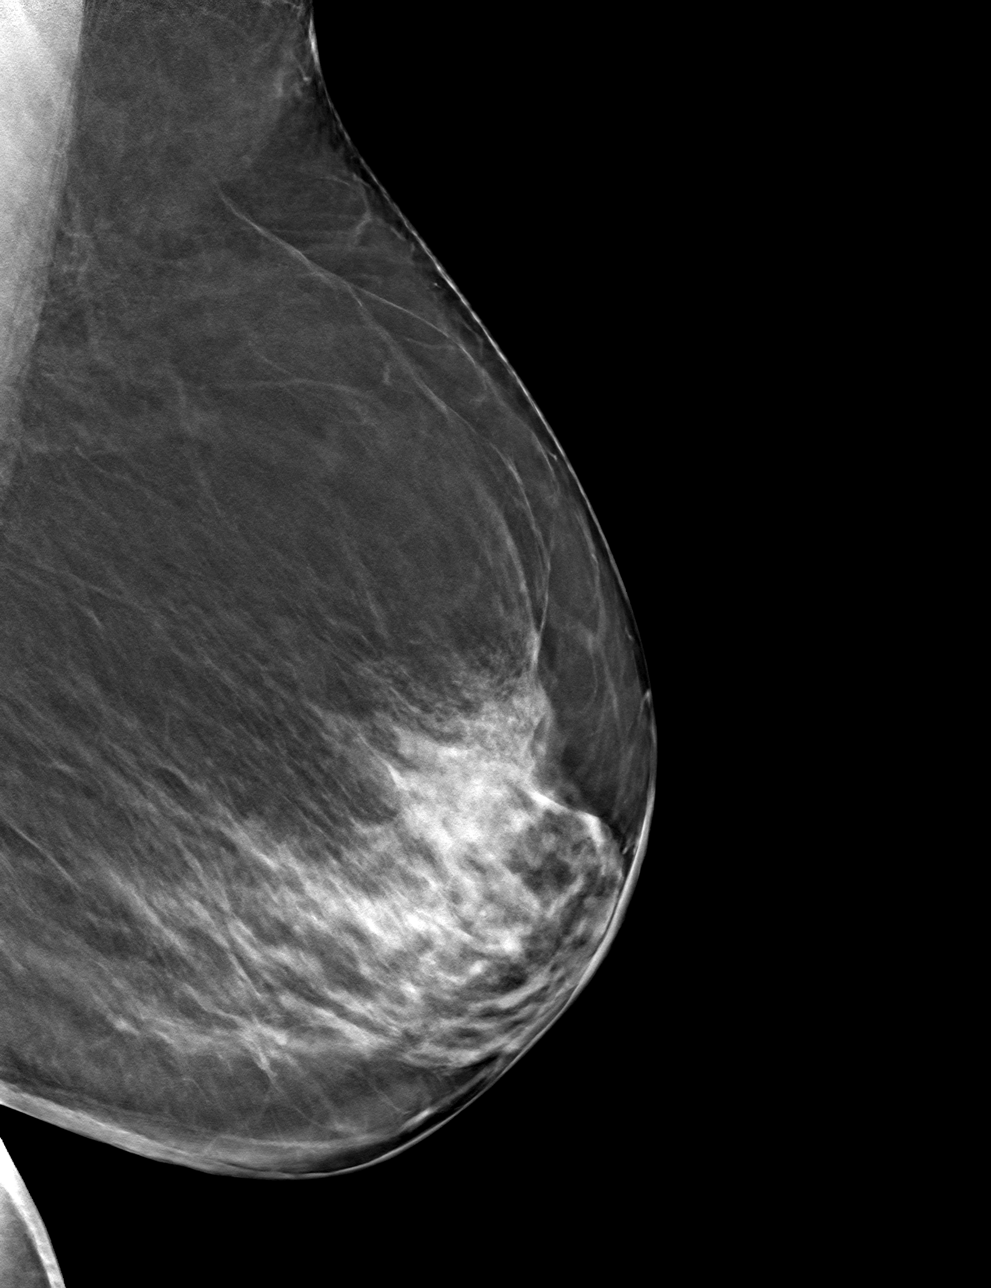

[4 of 12 positions shown; findings below may reference images not displayed]

ACR Breast Density Category c: The breast tissue is heterogeneously
dense, which may obscure small masses.
FINDINGS: Spot compression tomosynthesis cc and full field mL tomosynthesis
views of the left breast were performed for a questioned asymmetry
seen only on CC view in the medial left breast. On the additional
imaging the mammographic appearance of this area is stable dating
back to at least 9991, consistent with a benign process. There is no
new suspicious mass, distortion or asymmetry.
IMPRESSION: No mammographic evidence of malignancy in the medial left breast.

RECOMMENDATION:
Screening mammogram in one year.(Code:[id number]-H-VCR)

I have discussed the findings and recommendations with the patient.
If applicable, a reminder letter will be sent to the patient
regarding the next appointment.

BI-RADS CATEGORY  2: Benign.

## 2023-02-20 ENCOUNTER — Other Ambulatory Visit: Payer: Self-pay | Admitting: Internal Medicine

## 2023-02-20 DIAGNOSIS — Z1231 Encounter for screening mammogram for malignant neoplasm of breast: Secondary | ICD-10-CM

## 2023-03-22 ENCOUNTER — Ambulatory Visit
Admission: RE | Admit: 2023-03-22 | Discharge: 2023-03-22 | Disposition: A | Discharge status: Home / Self Care | Payer: Medicare Other | Source: Ambulatory Visit | Attending: Internal Medicine | Admitting: Internal Medicine

## 2023-03-22 DIAGNOSIS — Z1231 Encounter for screening mammogram for malignant neoplasm of breast: Secondary | ICD-10-CM

## 2024-02-14 ENCOUNTER — Other Ambulatory Visit: Payer: Self-pay | Admitting: Internal Medicine

## 2024-02-14 DIAGNOSIS — Z1231 Encounter for screening mammogram for malignant neoplasm of breast: Secondary | ICD-10-CM

## 2024-03-25 ENCOUNTER — Ambulatory Visit
Admission: RE | Admit: 2024-03-25 | Discharge: 2024-03-25 | Disposition: A | Source: Ambulatory Visit | Attending: Internal Medicine | Admitting: Internal Medicine

## 2024-03-25 DIAGNOSIS — Z1231 Encounter for screening mammogram for malignant neoplasm of breast: Secondary | ICD-10-CM
